# Patient Record
Sex: Female | Born: 1985 | Race: Black or African American | Hispanic: No | Marital: Single | Smoking: Current some day smoker
Health system: Southern US, Community
[De-identification: ages and names within clinical notes are randomized; demographics above are authoritative.]

## PROBLEM LIST (undated history)

## (undated) DIAGNOSIS — J45909 Unspecified asthma, uncomplicated: Secondary | ICD-10-CM

## (undated) DIAGNOSIS — F209 Schizophrenia, unspecified: Secondary | ICD-10-CM

## (undated) DIAGNOSIS — F319 Bipolar disorder, unspecified: Secondary | ICD-10-CM

---

## 2010-09-28 ENCOUNTER — Emergency Department (HOSPITAL_COMMUNITY)
Admission: EM | Admit: 2010-09-28 | Discharge: 2010-09-28 | Payer: Self-pay | Source: Home / Self Care | Admitting: Emergency Medicine

## 2010-12-22 ENCOUNTER — Encounter: Payer: Self-pay | Admitting: Obstetrics & Gynecology

## 2012-02-04 ENCOUNTER — Emergency Department (HOSPITAL_COMMUNITY): Payer: Medicaid Other

## 2012-02-04 ENCOUNTER — Emergency Department (HOSPITAL_COMMUNITY)
Admission: EM | Admit: 2012-02-04 | Discharge: 2012-02-04 | Disposition: A | Discharge status: Home / Self Care | Payer: Medicaid Other | Attending: Emergency Medicine | Admitting: Emergency Medicine

## 2012-02-04 ENCOUNTER — Encounter (HOSPITAL_COMMUNITY): Payer: Self-pay | Admitting: *Deleted

## 2012-02-04 DIAGNOSIS — R079 Chest pain, unspecified: Secondary | ICD-10-CM | POA: Insufficient documentation

## 2012-02-04 DIAGNOSIS — I319 Disease of pericardium, unspecified: Secondary | ICD-10-CM

## 2012-02-04 DIAGNOSIS — R45851 Suicidal ideations: Secondary | ICD-10-CM

## 2012-02-04 DIAGNOSIS — R404 Transient alteration of awareness: Secondary | ICD-10-CM | POA: Insufficient documentation

## 2012-02-04 HISTORY — DX: Schizophrenia, unspecified: F20.9

## 2012-02-04 HISTORY — DX: Bipolar disorder, unspecified: F31.9

## 2012-02-04 LAB — URINALYSIS, ROUTINE W REFLEX MICROSCOPIC
Bilirubin Urine: NEGATIVE
Glucose, UA: NEGATIVE mg/dL
Hgb urine dipstick: NEGATIVE
Protein, ur: NEGATIVE mg/dL
pH: 6 (ref 5.0–8.0)

## 2012-02-04 LAB — COMPREHENSIVE METABOLIC PANEL
ALT: 8 U/L (ref 0–35)
AST: 15 U/L (ref 0–37)
BUN: 11 mg/dL (ref 6–23)
Calcium: 9.8 mg/dL (ref 8.4–10.5)
GFR calc Af Amer: >90 mL/min (ref >90)
GFR calc non Af Amer: >90 mL/min (ref >90)
Glucose, Bld: 85 mg/dL (ref 70–99)
Total Protein: 8.6 g/dL — ABNORMAL HIGH (ref 6.0–8.3)

## 2012-02-04 LAB — RAPID URINE DRUG SCREEN, HOSP PERFORMED
Amphetamines: NOT DETECTED
Barbiturates: NOT DETECTED
Tetrahydrocannabinol: POSITIVE — AB

## 2012-02-04 LAB — DIFFERENTIAL
Lymphocytes Relative: 27 % (ref 12–46)
Monocytes Absolute: 0.4 10*3/uL (ref 0.1–1.0)
Monocytes Relative: 6 % (ref 3–12)
Neutro Abs: 4.4 10*3/uL (ref 1.7–7.7)

## 2012-02-04 LAB — TROPONIN I: Troponin I: <0.3 ng/mL (ref <0.30)

## 2012-02-04 LAB — CBC
Platelets: 257 10*3/uL (ref 150–400)
RBC: 4.43 MIL/uL (ref 3.87–5.11)
RDW: 12.5 % (ref 11.5–15.5)

## 2012-02-04 LAB — URINE MICROSCOPIC-ADD ON

## 2012-02-04 LAB — PREGNANCY, URINE: Preg Test, Ur: NEGATIVE

## 2012-02-04 MED ORDER — SERTRALINE HCL 50 MG PO TABS: 50.0000 mg | ORAL_TABLET | Freq: Every day | ORAL | Status: DC

## 2012-02-04 MED ORDER — SODIUM CHLORIDE 0.9 % IV BOLUS (SEPSIS): 1000.0000 mL | Freq: Once | INTRAVENOUS | Status: DC

## 2012-02-04 MED ORDER — IBUPROFEN 800 MG PO TABS
800.0000 mg | ORAL_TABLET | Freq: Once | ORAL | Status: AC
  Administered 2012-02-04: 800 mg via ORAL
  Filled 2012-02-04: qty 1

## 2012-02-04 MED ORDER — LORAZEPAM 1 MG PO TABS: 1.0000 mg | ORAL_TABLET | Freq: Three times a day (TID) | ORAL | Status: DC | PRN

## 2012-02-04 MED ORDER — ONDANSETRON HCL 4 MG PO TABS: 4.0000 mg | ORAL_TABLET | Freq: Three times a day (TID) | ORAL | Status: DC | PRN

## 2012-02-04 MED ORDER — IBUPROFEN 600 MG PO TABS: 600.0000 mg | ORAL_TABLET | Freq: Three times a day (TID) | ORAL | Status: DC | PRN

## 2012-02-04 NOTE — ED Notes (Signed)
Pt states she was on the  [place] feel to the floor and could not move. Pt states she is able to recall events

## 2012-02-04 NOTE — ED Provider Notes (Signed)
History     CSN: [id number]  Arrival date & time 02/04/12  1341   First MD Initiated Contact with Patient 02/04/12 1401      Chief Complaint  Patient presents with  . Seizures    (Consider location/radiation/quality/duration/timing/severity/associated sxs/prior treatment) HPI  26 year old female with history of schizophrenia, history of bipolar type1 presents with a syncopal episode. Patient was brought to the ED via EMS when family member noticed she passed out. Patient states she was having a verbal argument with her sister when she felt lightheadedness and had apparently "blacked out".  Sts she remembers hitting the side of her head against wall prior to fall.  According to nursing note, family member notices that pt fell, hits head to floor when syncopized.  Sts she regain consciousness but then passed out again.  Pt denies post ictal state.  She denies significant headache, tongue pain, or incontinence.  Her only complaint is chest pain.  Sts she has always had pain to her chest that are intermittent in episodes.  Pain is described as a stabbing sensation and can last for seconds to hours.  Nothing makes it better or worse.  Sts sometimes when she walks it may improves her pain.  She denies associated SOB, weakness or numbness.  Denies n/v/d, abd pain, or diarrhea.  Denies alcohol or rec drug use.  Sts she has normal appetite.  Denies sexual activity.  LMP 3-4 weeks ago.  Sts she did have one similar prior syncopal episode 3 years ago, but denies remembering what triggers it.  Denies exertional syncope.  Has his of childhood seizure but none since.    Past Medical History  Diagnosis Date  . Schizophrenia   . Bipolar 1 disorder     No past surgical history on file.  No family history on file.  History  Substance Use Topics  . Smoking status: Not on file  . Smokeless tobacco: Not on file  . Alcohol Use:     OB History    Grav Para Term Preterm Abortions TAB SAB Ect Mult Living                   Review of Systems  All other systems reviewed and are negative.    Allergies  Review of patient's allergies indicates no known allergies.  Home Medications  No current outpatient prescriptions on file.  BP 129/69  Pulse 78  Temp(Src) 98.1 F (36.7 C) (Oral)  Resp 18  SpO2 98%  Physical Exam  Nursing note and vitals reviewed. Constitutional: She is oriented to person, place, and time. She appears well-developed and well-nourished. No distress.       Awake, alert, nontoxic appearance  HENT:  Head: Normocephalic and atraumatic.  Right Ear: External ear normal.  Left Ear: External ear normal.  Mouth/Throat: Oropharynx is clear and moist. No oropharyngeal exudate.       No hemotympanum, no septal hematoma, head nontender with no evidence of trauma.  No tongue laceration  Eyes: Conjunctivae and EOM are normal. Pupils are equal, round, and reactive to light. Right eye exhibits no discharge. Left eye exhibits no discharge.  Neck: Normal range of motion. Neck supple.  Cardiovascular: Normal rate, regular rhythm, normal heart sounds and intact distal pulses.  Exam reveals no gallop and no friction rub.   No murmur heard. Pulmonary/Chest: Effort normal. No respiratory distress.         Tenderness to L chest on palpation.  No overlying skin changes  Abdominal:  Soft. There is no tenderness. There is no rebound.  Musculoskeletal: She exhibits no tenderness.       ROM appears intact, no obvious focal weakness  Neurological: She is alert and oriented to person, place, and time. She displays a negative Romberg sign. Coordination and gait normal. GCS eye subscore is 4. GCS verbal subscore is 5. GCS motor subscore is 6.       Mental status and motor strength appears intact  Skin: Skin is warm. No rash noted.  Psychiatric: She has a normal mood and affect.    ED Course  Procedures (including critical care time)  Labs Reviewed - No data to display No results  found.   No diagnosis found.   Date: 02/04/2012  Rate: 60  Rhythm: normal sinus rhythm  QRS Axis: normal  Intervals: normal  ST/T Wave abnormalities: PR depression  Conduction Disutrbances:none  Narrative Interpretation: ST elevation suggests pericarditis (unchanged)  Old EKG Reviewed: unchanged  Results for orders placed during the hospital encounter of 02/04/12  CBC      Component Value Range   WBC 6.7  4.0 - 10.5 (K/uL)   RBC 4.43  3.87 - 5.11 (MIL/uL)   Hemoglobin 14.4  12.0 - 15.0 (g/dL)   HCT 16.1  09.6 - 04.5 (%)   MCV 95.3  78.0 - 100.0 (fL)   MCH 32.5  26.0 - 34.0 (pg)   MCHC 34.1  30.0 - 36.0 (g/dL)   RDW 40.9  81.1 - 91.4 (%)   Platelets 257  150 - 400 (K/uL)  DIFFERENTIAL      Component Value Range   Neutrophils Relative 65  43 - 77 (%)   Neutro Abs 4.4  1.7 - 7.7 (K/uL)   Lymphocytes Relative 27  12 - 46 (%)   Lymphs Abs 1.8  0.7 - 4.0 (K/uL)   Monocytes Relative 6  3 - 12 (%)   Monocytes Absolute 0.4  0.1 - 1.0 (K/uL)   Eosinophils Relative 1  0 - 5 (%)   Eosinophils Absolute 0.1  0.0 - 0.7 (K/uL)   Basophils Relative 0  0 - 1 (%)   Basophils Absolute 0.0  0.0 - 0.1 (K/uL)  URINALYSIS, ROUTINE W REFLEX MICROSCOPIC      Component Value Range   Color, Urine YELLOW  YELLOW    APPearance CLOUDY (*) CLEAR    Specific Gravity, Urine 1.024  1.005 - 1.030    pH 6.0  5.0 - 8.0    Glucose, UA NEGATIVE  NEGATIVE (mg/dL)   Hgb urine dipstick NEGATIVE  NEGATIVE    Bilirubin Urine NEGATIVE  NEGATIVE    Ketones, ur TRACE (*) NEGATIVE (mg/dL)   Protein, ur NEGATIVE  NEGATIVE (mg/dL)   Urobilinogen, UA 1.0  0.0 - 1.0 (mg/dL)   Nitrite NEGATIVE  NEGATIVE    Leukocytes, UA TRACE (*) NEGATIVE   PREGNANCY, URINE      Component Value Range   Preg Test, Ur NEGATIVE  NEGATIVE   TROPONIN I      Component Value Range   Troponin I <0.30  <0.30 (ng/mL)  COMPREHENSIVE METABOLIC PANEL      Component Value Range   Sodium 135  135 - 145 (mEq/L)   Potassium 3.8  3.5 - 5.1  (mEq/L)   Chloride 99  96 - 112 (mEq/L)   CO2 24  19 - 32 (mEq/L)   Glucose, Bld 85  70 - 99 (mg/dL)   BUN 11  6 - 23 (mg/dL)  Creatinine, Ser 0.74  0.50 - 1.10 (mg/dL)   Calcium 9.8  8.4 - 78.4 (mg/dL)   Total Protein 8.6 (*) 6.0 - 8.3 (g/dL)   Albumin 4.7  3.5 - 5.2 (g/dL)   AST 15  0 - 37 (U/L)   ALT 8  0 - 35 (U/L)   Alkaline Phosphatase 48  39 - 117 (U/L)   Total Bilirubin 0.7  0.3 - 1.2 (mg/dL)   GFR calc non Af Amer >90  >90 (mL/min)   GFR calc Af Amer >90  >90 (mL/min)  URINE RAPID DRUG SCREEN (HOSP PERFORMED)      Component Value Range   Opiates NONE DETECTED  NONE DETECTED    Cocaine NONE DETECTED  NONE DETECTED    Benzodiazepines NONE DETECTED  NONE DETECTED    Amphetamines NONE DETECTED  NONE DETECTED    Tetrahydrocannabinol POSITIVE (*) NONE DETECTED    Barbiturates NONE DETECTED  NONE DETECTED   URINE MICROSCOPIC-ADD ON      Component Value Range   WBC, UA 0-2  <3 (WBC/hpf)   Urine-Other MUCOUS PRESENT     Dg Chest 2 View  02/04/2012  *RADIOLOGY REPORT*  Clinical Data: Chest pain  CHEST - 2 VIEW  Comparison: 02/04/2012  Findings: Cardiomediastinal silhouette is stable.  No acute infiltrate or pleural effusion.  No pulmonary edema.  Bony thorax is stable.  IMPRESSION: No active disease.  No significant change.  Original Report Authenticated By: Natasha Mead, M.D.   Ct Head Wo Contrast  02/04/2012  *RADIOLOGY REPORT*  Clinical Data:  Fall, loss of consciousness, seizure activity  CT HEAD WITHOUT CONTRAST  Technique:  Contiguous axial images were obtained from the base of the skull through the vertex without contrast  Comparison:  None.  Findings:  The brain has a normal appearance without evidence for hemorrhage, acute infarction, hydrocephalus, or mass lesion.  There is no extra axial fluid collection.  The skull and paranasal sinuses are normal.  IMPRESSION: Normal CT of the head without contrast.  Original Report Authenticated By: Judie Petit. Ruel Favors, M.D.      MDM   Initially, pt was seen for a syncopal episode.  However, family member sts pt is actually having suicidal ideation.  Per sister, patient had an argument with her for no apparent reason, and pt also sts she wants to kill herself and attempt to ingest a full bottle of her depression medication.  However, pt apparently falls to the ground striking her head against door knob and convulsed for 5 min.  Sister is concern that she is not taking her psych medication as recommended.  Does not think she is actually trying to harm herself but wants pt to be reevaluated.    3:34 PM Pt sts she does have thoughts about hurting herself and also thoughts about hurting other people (people that have done wrong to me).  She does not have specific plan.  She is amenable for psych consultation.  I will consult with ACT team for further evaluation.  Pt's cp is more consistent with pericarditis and less likely ACS.  Her CXR is unremarkable.  I have discussed with my attending.  Will treat with ibuprofen.  Cardiology referral given.    3:38 PM I have consulted with ACT team, who agrees to see and evaluated pt.  Pt will need IVC due to recent suicide attempt.     5:53 PM Pt is medically cleared.  Denies cp currently.  Labs are unremarkable.  Head CT and  CXR shows no acute abnormalities.  Pt will be management by ACT for further evaluation of her SI/HI.  Negative delta troponin.    6:11 PM Pt sts she does not want to harm herself.  She thinks she needs to take her antidepressive medication.  She is amendable to f/u with psych.  I will await ACT team to assess pt.  Telepsych ordered.  Pt and family member aware of plan.    7:50 PM If pt is safe to be discharge via telepsych, then please establish f/u guideline.  Her chest pain should be reevaluated by cardiology, which i have placed [facility] cardiology referral.  She should take OTC ibuprofen for pain.   8:13 PM Pt awaits telepsych. Pt report given to Langley Adie, PA-C  who will continue pt's care.   Fayrene Helper, PA-C 02/04/12 2014  Fayrene Helper, PA-C 02/04/12 2014

## 2012-02-04 NOTE — ED Notes (Signed)
Pt discharge instructions reviewed and pt verbalizes understanding. Referrals given by ACT. Pt denies SI/HI and AVH. Departs unit in safe and stable condition.

## 2012-02-04 NOTE — ED Notes (Signed)
WUJ:WJXB<JY> Expected date:02/04/12<BR> Expected time: 1:34 PM<BR> Means of arrival:Ambulance<BR> Comments:<BR> EMS 31 GC. 26 y/o female. Seizure with history of same.

## 2012-02-04 NOTE — ED Notes (Signed)
Sister at bedside-no SI at this time-will continue to monitor

## 2012-02-04 NOTE — ED Notes (Signed)
Refusing iv placement at this time

## 2012-02-04 NOTE — ED Notes (Signed)
Per PA, ok to leave out iv

## 2012-02-04 NOTE — Discharge Instructions (Signed)
Take over the counter ibuprofen for your chest pain.  Follow up with [facility] cardiology at your earliest convenient for further evaluation of your chest pain.  Take your psychiatric medication as recommended.  Follow up with your psychiatrist for further evaluation.  Return to ER if you have any concerns.    Pericarditis Pericarditis is an inflammation of the sac that surrounds the heart. This sac is known as the pericardium. Typically the pericardium contains a smooth lubricating lining. When you have pericarditis, the lining is more like sandpaper. CAUSES   Viral or bacterial infections.   Heart disease.   Heart surgery.   Reaction to a drug.   Kidney failure.   Thyroid problems.   Arthritis.   Cancer.  SYMPTOMS   Sharp chest pain under the breast bone (sternum).   Pain may also be felt in the neck, back or arms.   Pain may feel worse if you lean forward, take a deep breath, or lie down.   Shortness of breath.   Fever.   Pain on swallowing.  DIAGNOSIS  The diagnosis of pericarditis is based on your history,exam findings, and tests. These may include an EKG, chest x-ray, CT studies, blood tests, and echocardiogram.  TREATMENT  Treatment for pericarditis includes medicine for discomfort. An antibiotic may also be needed. With proper treatment, most episodes of pericarditis get better without complications. HOME CARE INSTRUCTIONS   Get plenty of rest.   Avoid activities that increase your pain.   Do not smoke or drink alcohol.   Be sure to see your caregiver as recommended for follow-up to make sure your condition resolves completely.  SEEK IMMEDIATE MEDICAL CARE IF:  You develop pain or shortness of breath that is getting worse.   You develop a high fever and severe abdominal or back pain.   You develop marked weakness, fainting, or any other serious complaint.  Document Released: 11/19/2004 Document Revised: 10/01/2011 Document Reviewed: 01/09/2008 [facility]  Patient Information 2012 [place], [place].

## 2012-02-04 NOTE — ED Notes (Signed)
Per discussion with patients sister, pt with suicidal ideation. MD informed, will monitor and evaluate.
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
## 2012-02-04 NOTE — ED Notes (Signed)
Pt. Family( sister) has belonging
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
## 2012-02-08 NOTE — ED Provider Notes (Signed)
Medical screening examination/treatment/procedure(s) were performed by non-physician practitioner and as supervising physician I was immediately available for consultation/collaboration.   Kier Smead, MD 02/08/12 1509 

## 2013-01-05 ENCOUNTER — Encounter (HOSPITAL_COMMUNITY): Payer: Self-pay | Admitting: Emergency Medicine

## 2013-01-05 ENCOUNTER — Emergency Department (INDEPENDENT_AMBULATORY_CARE_PROVIDER_SITE_OTHER)
Admission: EM | Admit: 2013-01-05 | Discharge: 2013-01-05 | Disposition: A | Discharge status: Home / Self Care | Payer: Medicaid Other | Source: Home / Self Care

## 2013-01-05 DIAGNOSIS — S139XXA Sprain of joints and ligaments of unspecified parts of neck, initial encounter: Secondary | ICD-10-CM

## 2013-01-05 DIAGNOSIS — S161XXA Strain of muscle, fascia and tendon at neck level, initial encounter: Secondary | ICD-10-CM

## 2013-01-05 DIAGNOSIS — M549 Dorsalgia, unspecified: Secondary | ICD-10-CM

## 2013-01-05 MED ORDER — CYCLOBENZAPRINE HCL 10 MG PO TABS: 5.0000 mg | ORAL_TABLET | Freq: Three times a day (TID) | ORAL | Status: AC | PRN

## 2013-01-05 MED ORDER — IBUPROFEN 600 MG PO TABS: 600.0000 mg | ORAL_TABLET | Freq: Four times a day (QID) | ORAL | Status: AC | PRN
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
## 2013-01-06 NOTE — ED Provider Notes (Signed)
Medical screening examination/treatment/procedure(s) were performed by a resident physician and as supervising physician I was immediately available for consultation/collaboration.  Leslee Home, M.D.   Reuben Likes, MD 01/06/13 [phone]

## 2013-06-07 IMAGING — CT CT HEAD W/O CM
2 series · 16 of 30 positions shown, 20 images · non-contrast
Comparison: None.

CLINICAL DATA: Fall, loss of consciousness, seizure activity

CT HEAD WITHOUT CONTRAST
TECHNIQUE: Contiguous axial images were obtained from the base of
the skull through the vertex without contrast

[Series 2: head w/o · axial · non-contrast · 0.43mm/px · z∈[-154,-34]mm · 13 of 29 slices shown, 17 images]
[im 3/29  brain]
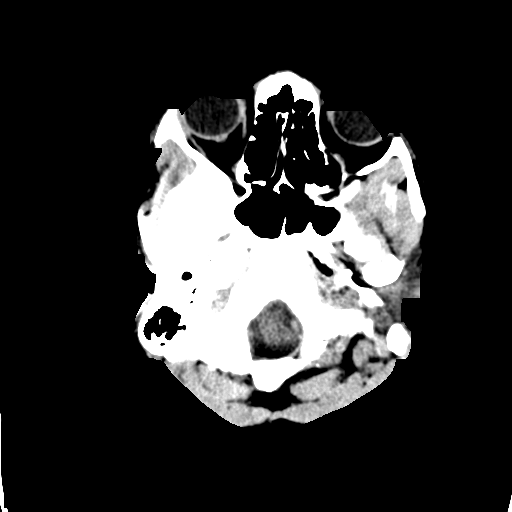
[im 3/29  bone]
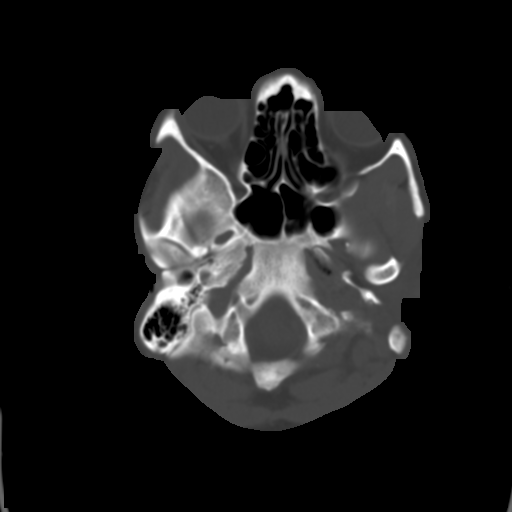
[im 5/29  brain]
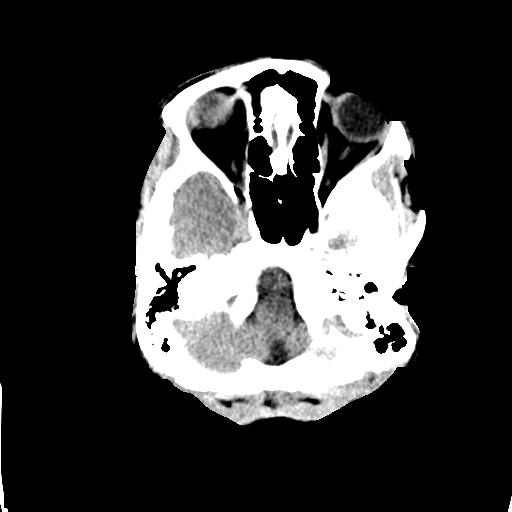
[im 7/29  brain]
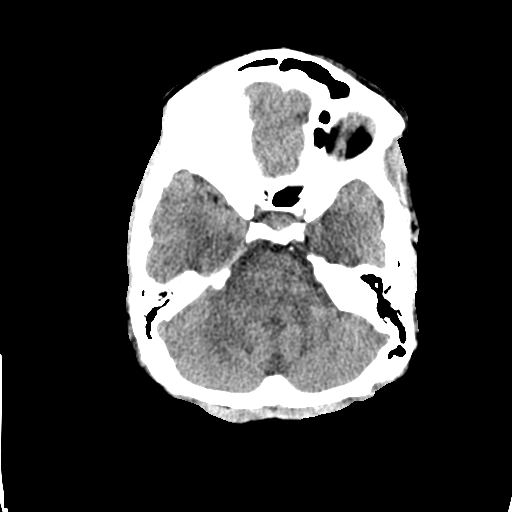
[im 9/29  brain]
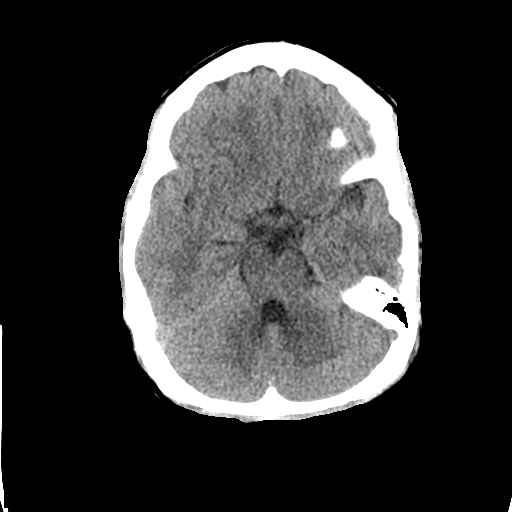
[im 11/29  brain]
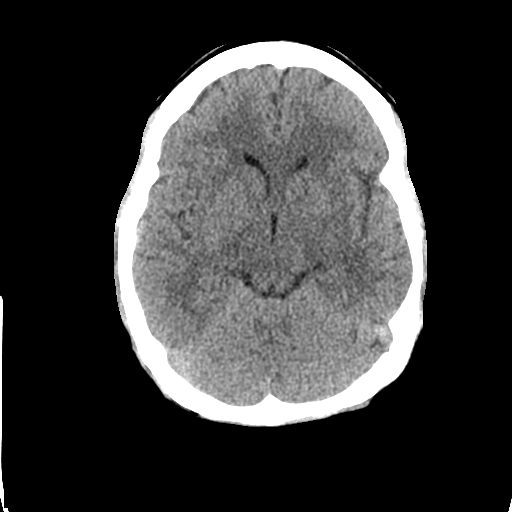
[im 11/29  bone]
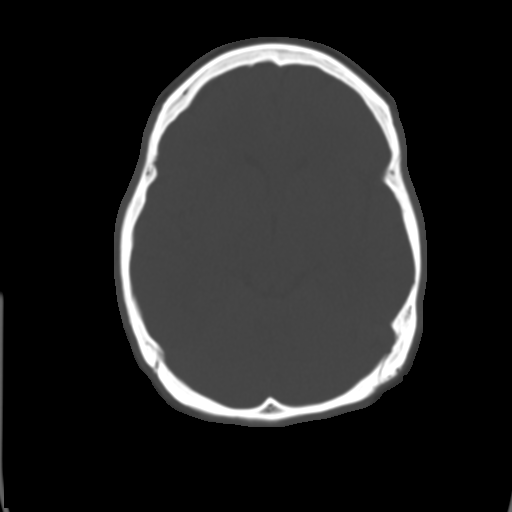
[im 13/29  brain]
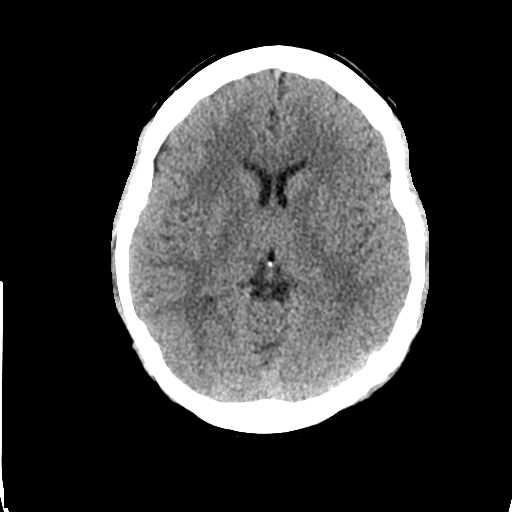
[im 15/29  brain]
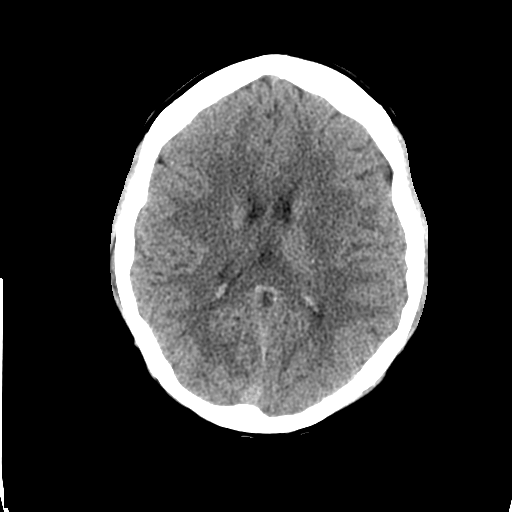
[im 17/29  brain]
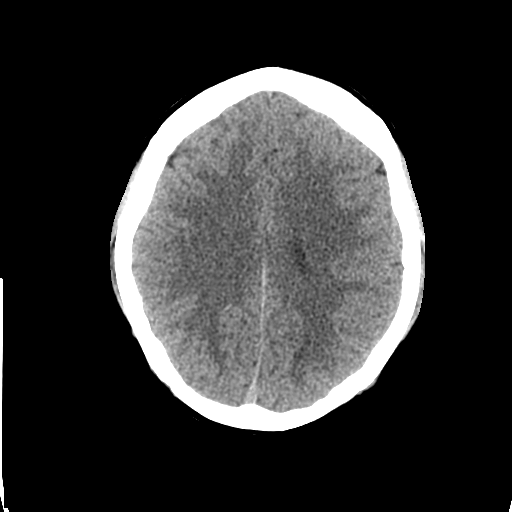
[im 19/29  brain]
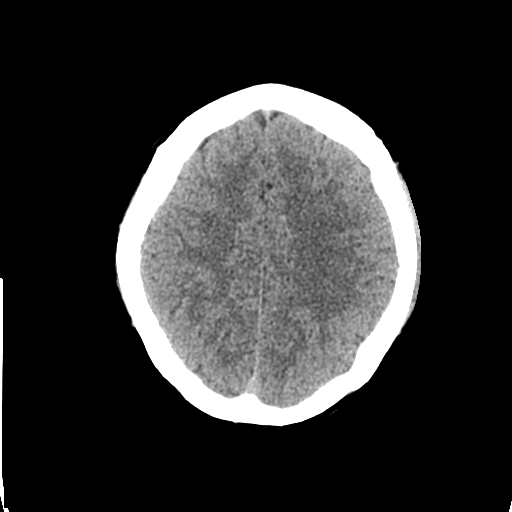
[im 19/29  bone]
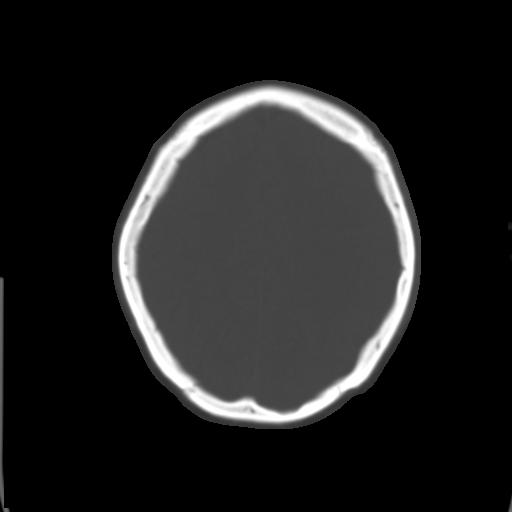
[im 21/29  brain]
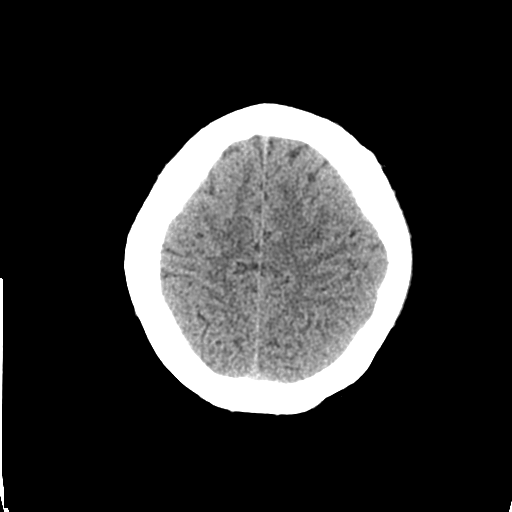
[im 23/29  brain]
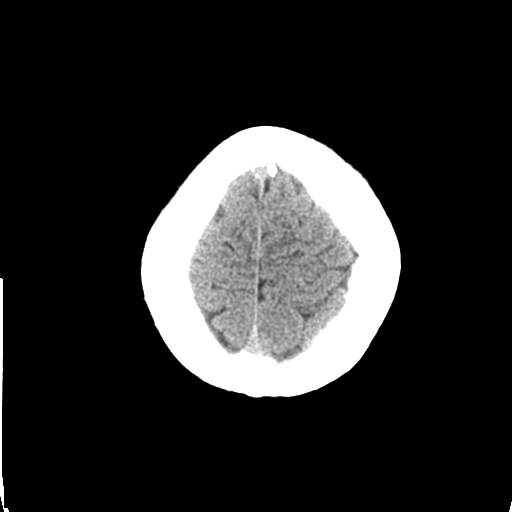
[im 25/29  brain]
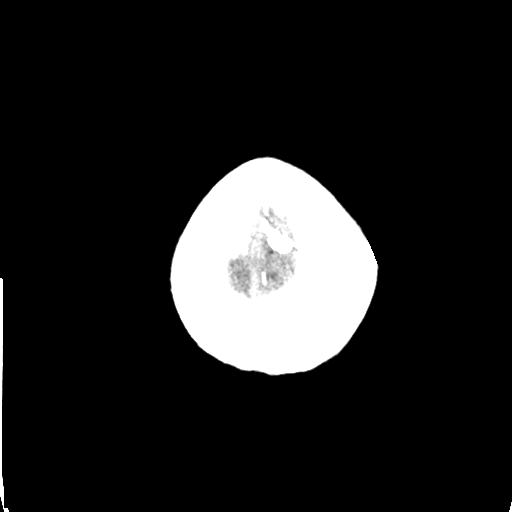
[im 27/29  brain]
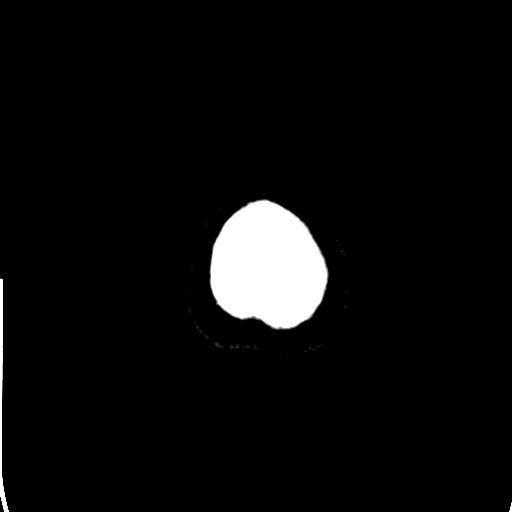
[im 27/29  bone]
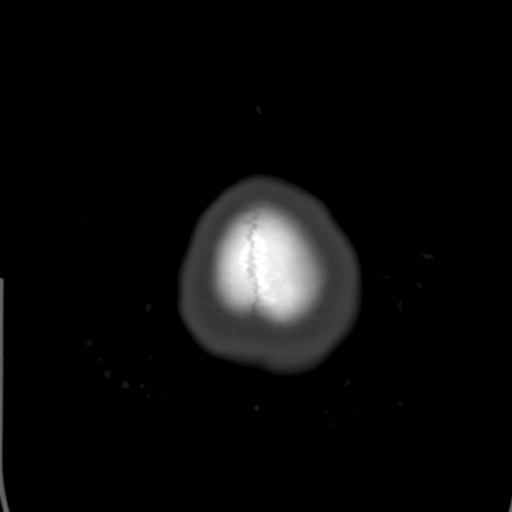

[Series 3: bone windows · axial · 0.43mm/px · z∈[-154,-114]mm · 3 of 29 slices shown]
[im 3/29  bone]
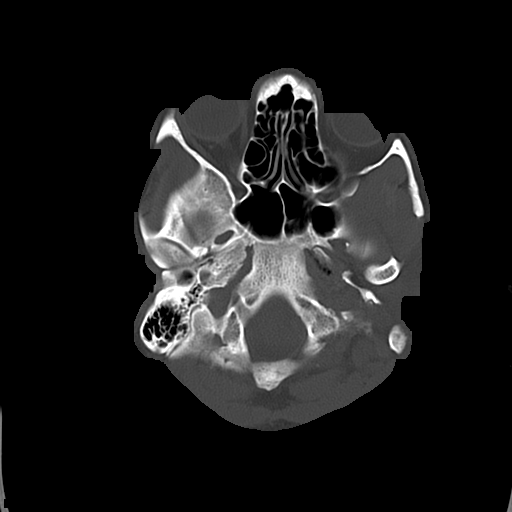
[im 7/29  bone]
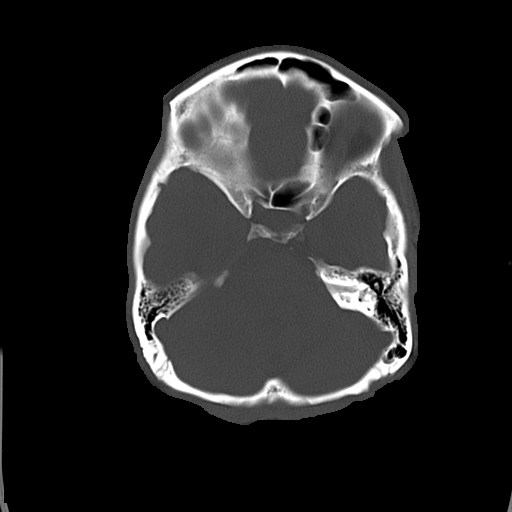
[im 11/29  bone]
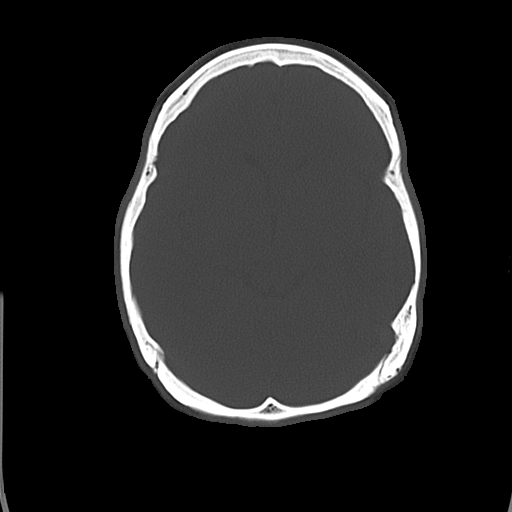

[16 of 30 positions shown; findings below may reference images not displayed]

FINDINGS: The brain has a normal appearance without evidence for
hemorrhage, acute infarction, hydrocephalus, or mass lesion.  There
is no extra axial fluid collection.  The skull and paranasal
sinuses are normal.
IMPRESSION: Normal CT of the head without contrast.

## 2014-12-25 ENCOUNTER — Emergency Department (HOSPITAL_COMMUNITY)
Admission: EM | Admit: 2014-12-25 | Discharge: 2014-12-25 | Disposition: A | Discharge status: Home / Self Care | Payer: Medicaid Other | Attending: Emergency Medicine | Admitting: Emergency Medicine

## 2014-12-25 ENCOUNTER — Encounter (HOSPITAL_COMMUNITY): Payer: Self-pay | Admitting: Emergency Medicine

## 2014-12-25 DIAGNOSIS — Z8659 Personal history of other mental and behavioral disorders: Secondary | ICD-10-CM | POA: Insufficient documentation

## 2014-12-25 DIAGNOSIS — H9201 Otalgia, right ear: Secondary | ICD-10-CM | POA: Diagnosis present

## 2014-12-25 DIAGNOSIS — H6121 Impacted cerumen, right ear: Secondary | ICD-10-CM | POA: Insufficient documentation

## 2014-12-25 DIAGNOSIS — Z720 Tobacco use: Secondary | ICD-10-CM | POA: Insufficient documentation

## 2014-12-25 MED ORDER — HYDROCODONE-ACETAMINOPHEN 5-325 MG PO TABS
1.0000 | ORAL_TABLET | Freq: Once | ORAL | Status: DC
  Filled 2014-12-25: qty 1

## 2014-12-25 MED ORDER — HYDROCODONE-ACETAMINOPHEN 5-325 MG PO TABS: 1.0000 | ORAL_TABLET | ORAL | Status: AC | PRN

## 2014-12-25 MED ORDER — CARBAMIDE PEROXIDE 6.5 % OT SOLN: 5.0000 [drp] | Freq: Once | OTIC | Status: DC

## 2014-12-25 MED ORDER — OFLOXACIN 0.3 % OP SOLN
5.0000 [drp] | Freq: Every day | OPHTHALMIC | Status: DC
  Administered 2014-12-25: 5 [drp] via OTIC
  Filled 2014-12-25: qty 5

## 2014-12-25 NOTE — ED Notes (Signed)
Pt states that she has not been able to hear out of right ear since last night with some pain.

## 2014-12-25 NOTE — Discharge Instructions (Signed)
You have increased redness in the external canal of your right ear. Please use the Ocuflox drops 3 times daily for 5 days. Cerumen Impaction A cerumen impaction is when the wax in your ear forms a plug. This plug usually causes reduced hearing. Sometimes it also causes an earache or dizziness. Removing a cerumen impaction can be difficult and painful. The wax sticks to the ear canal. The canal is sensitive and bleeds easily. If you try to remove a heavy wax buildup with a cotton tipped swab, you may push it in further. Irrigation with water, suction, and small ear curettes may be used to clear out the wax. If the impaction is fixed to the skin in the ear canal, ear drops may be needed for a few days to loosen the wax. People who build up a lot of wax frequently can use ear wax removal products available in your local drugstore. SEEK MEDICAL CARE IF:  You develop an earache, increased hearing loss, or marked dizziness. Document Released: 11/19/2004 Document Revised: 01/04/2012 Document Reviewed: 01/09/2010 [facility] Patient Information 2015 [place], [place]. This information is not intended to replace advice given to you by your health care provider. Make sure you discuss any questions you have with your health care provider. . Use Tylenol or ibuprofen for the throbbing, may use Norco for more severe pain. Norco may cause drowsiness, please use with caution.

## 2014-12-25 NOTE — ED Provider Notes (Signed)
CSN: [id number]     Arrival date & time 12/25/14  1355 History   First MD Initiated Contact with Patient 12/25/14 1428     Chief Complaint  Patient presents with  . Otalgia     (Consider location/radiation/quality/duration/timing/severity/associated sxs/prior Treatment) Patient is a 29 y.o. female presenting with ear pain. The history is provided by the patient.  Otalgia Location:  Right Behind ear:  No abnormality Severity:  Moderate Onset quality:  Gradual Duration:  1 day Timing:  Intermittent Progression:  Worsening Associated symptoms: no abdominal pain, no cough and no neck pain     Past Medical History  Diagnosis Date  . Schizophrenia   . Bipolar 1 disorder    History reviewed. No pertinent past surgical history. History reviewed. No pertinent family history. History  Substance Use Topics  . Smoking status: Current Some Day Smoker -- 0.50 packs/day    Types: Cigarettes  . Smokeless tobacco: Not on file  . Alcohol Use: Yes     Comment: occasional   OB History    No data available     Review of Systems  Constitutional: Negative for activity change.       All ROS Neg except as noted in HPI  HENT: Positive for ear pain. Negative for nosebleeds.   Eyes: Negative for photophobia and discharge.  Respiratory: Negative for cough, shortness of breath and wheezing.   Cardiovascular: Negative for chest pain and palpitations.  Gastrointestinal: Negative for abdominal pain and blood in stool.  Genitourinary: Negative for dysuria, frequency and hematuria.  Musculoskeletal: Negative for back pain, arthralgias and neck pain.  Skin: Negative.   Neurological: Negative for dizziness, seizures and speech difficulty.  Psychiatric/Behavioral: Negative for hallucinations and confusion.      Allergies  Review of patient's allergies indicates no known allergies.  Home Medications   Prior to Admission medications   Medication Sig Start Date End Date Taking? Authorizing  Provider  cyclobenzaprine (FLEXERIL) 10 MG tablet Take 0.5-1 tablets (5-10 mg total) by mouth 3 (three) times daily as needed for muscle spasms. Patient not taking: Reported on 12/25/2014 01/05/13   Marga HootsJacquelyn A McGill, MD  ibuprofen (ADVIL,MOTRIN) 600 MG tablet Take 1 tablet (600 mg total) by mouth every 6 (six) hours as needed for pain. Patient not taking: Reported on 12/25/2014 01/05/13   Harvel QualeJacquelyn A McGill, MD   BP 117/79 mmHg  Pulse 74  Temp(Src) 98.7 F (37.1 C) (Oral)  Resp 18  Ht 5\' 8"  (1.727 m)  Wt 173 lb 2 oz (78.529 kg)  BMI 26.33 kg/m2  SpO2 99%  LMP 12/19/2014 Physical Exam  Constitutional: She is oriented to person, place, and time. She appears well-developed and well-nourished.  Non-toxic appearance.  HENT:  Head: Normocephalic.  Right Ear: Tympanic membrane normal. There is tenderness. No drainage or swelling. No foreign bodies. No mastoid tenderness. Decreased hearing is noted.  Left Ear: Hearing, tympanic membrane, external ear and ear canal normal.  The right EAC is occluded with cerumen. There are areas of increase redness in the EAC.  Eyes: EOM and lids are normal. Pupils are equal, round, and reactive to light.  Neck: Normal range of motion. Neck supple. Carotid bruit is not present.  Cardiovascular: Normal rate, regular rhythm, normal heart sounds, intact distal pulses and normal pulses.   Pulmonary/Chest: Breath sounds normal. No respiratory distress.  Abdominal: Soft. Bowel sounds are normal. There is no tenderness. There is no guarding.  Musculoskeletal: Normal range of motion.  Lymphadenopathy:  Head (right side): No submandibular adenopathy present.       Head (left side): No submandibular adenopathy present.    She has no cervical adenopathy.  Neurological: She is alert and oriented to person, place, and time. She has normal strength. No cranial nerve deficit or sensory deficit.  Skin: Skin is warm and dry.  Psychiatric: She has a normal mood and affect.  Her speech is normal.  Nursing note and vitals reviewed.   ED Course  Procedures (including critical care time) Labs Review Labs Reviewed - No data to display  Imaging Review No results found.   EKG Interpretation None      MDM The right ear was irrigated with removal of cerumen impaction. Repeat examination reveals increased redness of the external auditory canal, and the tympanic membrane is without problem.    Final diagnoses:  None    **I have reviewed nursing notes, vital signs, and all appropriate lab and imaging results for this patient.Kathie Dike, PA-C 12/25/14 1723  Flint Melter, MD 12/26/14 [phone]

## 2016-07-29 ENCOUNTER — Emergency Department (HOSPITAL_COMMUNITY)
Admission: EM | Admit: 2016-07-29 | Discharge: 2016-07-29 | Disposition: A | Discharge status: Home / Self Care | Payer: Medicaid Other | Attending: Emergency Medicine | Admitting: Emergency Medicine

## 2016-07-29 ENCOUNTER — Emergency Department (HOSPITAL_COMMUNITY): Payer: Medicaid Other

## 2016-07-29 ENCOUNTER — Encounter (HOSPITAL_COMMUNITY): Payer: Self-pay | Admitting: Emergency Medicine

## 2016-07-29 DIAGNOSIS — R05 Cough: Secondary | ICD-10-CM | POA: Diagnosis present

## 2016-07-29 DIAGNOSIS — B9789 Other viral agents as the cause of diseases classified elsewhere: Secondary | ICD-10-CM

## 2016-07-29 DIAGNOSIS — Z87898 Personal history of other specified conditions: Secondary | ICD-10-CM

## 2016-07-29 DIAGNOSIS — F17210 Nicotine dependence, cigarettes, uncomplicated: Secondary | ICD-10-CM | POA: Diagnosis not present

## 2016-07-29 DIAGNOSIS — J069 Acute upper respiratory infection, unspecified: Secondary | ICD-10-CM | POA: Insufficient documentation

## 2016-07-29 MED ORDER — ALBUTEROL SULFATE HFA 108 (90 BASE) MCG/ACT IN AERS
2.0000 | INHALATION_SPRAY | Freq: Once | RESPIRATORY_TRACT | Status: AC
  Administered 2016-07-29: 2 via RESPIRATORY_TRACT
  Filled 2016-07-29: qty 6.7

## 2016-07-29 NOTE — ED Provider Notes (Signed)
[facility]EM[facility]Y DEPT Provider Note   CSN: [id number] Arrival date & time: 07/29/16  1105     History   Chief Complaint Chief Complaint  Patient presents with  . Cough    HPI Misty Munoz is a 30 y.o. female presenting with a non productive cough and wheezing which started yesterday evening.  She denies fevers or chills, chest pain or shortness of breath, and is a nonsmoker, but endorses she is exposed to heavy secondhand smoke.  She does not have a history of asthma but reports wheezing yesterday evening prior to bed.  She has had no headaches, dizziness, abdominal pain, nausea or vomiting.  She's had no medications prior to arrival.  She has had mild nasal congestion with clear rhinorrhea.  The history is provided by the patient.    Past Medical History:  Diagnosis Date  . Bipolar 1 disorder (HCC)   . Schizophrenia ([facility])     There are no active problems to display for this patient.   History reviewed. No pertinent surgical history.  OB History    No data available       Home Medications    Prior to Admission medications   Medication Sig Start Date End Date Taking? Authorizing Provider  cyclobenzaprine (FLEXERIL) 10 MG tablet Take 0.5-1 tablets (5-10 mg total) by mouth 3 (three) times daily as needed for muscle spasms. Patient not taking: Reported on 12/25/2014 01/05/13   Marga Hoots McGill, MD  HYDROcodone-acetaminophen (NORCO/VICODIN) 5-325 MG per tablet Take 1 tablet by mouth every 4 (four) hours as needed. 12/25/14   Ivery Quale, PA-C  ibuprofen (ADVIL,MOTRIN) 600 MG tablet Take 1 tablet (600 mg total) by mouth every 6 (six) hours as needed for pain. Patient not taking: Reported on 12/25/2014 01/05/13   Tito Dine, MD    Family History No family history on file.  Social History Social History  Substance Use Topics  . Smoking status: Current Some Day Smoker    Packs/day: 0.50    Types: Cigarettes  . Smokeless tobacco: Never Used  . Alcohol use Yes   Comment: occasional     Allergies   Review of patient's allergies indicates no known allergies.   Review of Systems Review of Systems  Constitutional: Negative for fever.  HENT: Positive for congestion and rhinorrhea. Negative for sore throat.   Eyes: Negative.   Respiratory: Positive for cough and wheezing. Negative for chest tightness and shortness of breath.   Cardiovascular: Negative for chest pain.  Gastrointestinal: Negative for abdominal pain, nausea and vomiting.  Genitourinary: Negative.   Musculoskeletal: Negative for arthralgias, joint swelling and neck pain.  Skin: Negative.  Negative for rash and wound.  Neurological: Negative for dizziness, weakness, light-headedness, numbness and headaches.  Psychiatric/Behavioral: Negative.      Physical Exam Updated Vital Signs BP 128/82   Pulse 81   Temp 98.5 F (36.9 C)   Resp 18   Ht 5\' 8"  (1.727 m)   LMP 07/29/2016   SpO2 99%   Physical Exam  Constitutional: She is oriented to person, place, and time. She appears well-developed and well-nourished.  HENT:  Head: Normocephalic and atraumatic.  Right Ear: Tympanic membrane and ear canal normal.  Left Ear: Tympanic membrane and ear canal normal.  Nose: Mucosal edema and rhinorrhea present.  Mouth/Throat: Uvula is midline, oropharynx is clear and moist and mucous membranes are normal. No oropharyngeal exudate, posterior oropharyngeal edema, posterior oropharyngeal erythema or tonsillar abscesses.  Eyes: Conjunctivae are normal.  Cardiovascular: Normal rate  and normal heart sounds.   Pulmonary/Chest: Effort normal. No respiratory distress. She has no wheezes. She has no rales.  Lungs clear to auscultation bilaterally.  Abdominal: Soft. There is no tenderness.  Musculoskeletal: Normal range of motion.  Neurological: She is alert and oriented to person, place, and time.  Skin: Skin is warm and dry. No rash noted.  Psychiatric: She has a normal mood and affect.      ED Treatments / Results  Labs (all labs ordered are listed, but only abnormal results are displayed) Labs Reviewed - No data to display  EKG  EKG Interpretation None       Radiology Dg Chest 2 View  Result Date: 07/29/2016 CLINICAL DATA:  Short of breath, wheezing EXAM: CHEST  2 VIEW COMPARISON:  None. FINDINGS: Normal mediastinum and cardiac silhouette. Normal pulmonary vasculature. No evidence of effusion, infiltrate, or pneumothorax. No acute bony abnormality. IMPRESSION: Normal chest radiograph Electronically Signed   By: Genevive BiStewart  Edmunds M.D.   On: 07/29/2016 12:45    Procedures Procedures (including critical care time)  Medications Ordered in ED Medications  albuterol (PROVENTIL HFA;VENTOLIN HFA) 108 (90 Base) MCG/ACT inhaler 2 puff (2 puffs Inhalation Given 07/29/16 1330)     Initial Impression / Assessment and Plan / ED Course  I have reviewed the triage vital signs and the nursing notes.  Pertinent labs & imaging results that were available during my care of the patient were reviewed by me and considered in my medical decision making (see chart for details).  Clinical Course    Examined history suggesting viral process.  No wheezing on exam but given history will treat with albuterol.  She was given an MDI with a spacer and instructed in home use.  Advise when necessary follow-up for any persistent or worsening symptoms.  She was given referrals for obtaining a PCP.  Final Clinical Impressions(s) / ED Diagnoses   Final diagnoses:  Viral URI with cough  H/O wheezing    New Prescriptions New Prescriptions   No medications on file     Burgess AmorJulie Ayvin Lipinski, PA-C 07/29/16 1343    Linwood DibblesJon Knapp, MD 07/29/16 1432

## 2016-07-29 NOTE — Discharge Instructions (Signed)
You may take 2 puffs of the albuterol inhaler every 4 hours if you are coughing or wheezing.

## 2016-07-29 NOTE — ED Triage Notes (Signed)
Pt c/o cough since last night. Productive cough- clear sputum.

## 2021-12-19 ENCOUNTER — Encounter (HOSPITAL_COMMUNITY): Payer: Self-pay | Admitting: Emergency Medicine

## 2021-12-19 ENCOUNTER — Emergency Department (HOSPITAL_COMMUNITY)
Admission: EM | Admit: 2021-12-19 | Discharge: 2021-12-20 | Disposition: A | Discharge status: Home / Self Care | Payer: Medicaid Other | Attending: Emergency Medicine | Admitting: Emergency Medicine

## 2021-12-19 ENCOUNTER — Other Ambulatory Visit: Payer: Self-pay

## 2021-12-19 ENCOUNTER — Encounter (HOSPITAL_COMMUNITY): Payer: Self-pay

## 2021-12-19 ENCOUNTER — Ambulatory Visit (HOSPITAL_COMMUNITY)
Admission: EM | Admit: 2021-12-19 | Discharge: 2021-12-19 | Disposition: A | Discharge status: Home / Self Care | Payer: Medicaid Other | Attending: Nurse Practitioner | Admitting: Nurse Practitioner

## 2021-12-19 ENCOUNTER — Ambulatory Visit: Payer: Self-pay

## 2021-12-19 DIAGNOSIS — M79605 Pain in left leg: Secondary | ICD-10-CM

## 2021-12-19 DIAGNOSIS — R0789 Other chest pain: Secondary | ICD-10-CM | POA: Insufficient documentation

## 2021-12-19 DIAGNOSIS — J45909 Unspecified asthma, uncomplicated: Secondary | ICD-10-CM | POA: Insufficient documentation

## 2021-12-19 DIAGNOSIS — M7989 Other specified soft tissue disorders: Secondary | ICD-10-CM

## 2021-12-19 DIAGNOSIS — R600 Localized edema: Secondary | ICD-10-CM | POA: Diagnosis not present

## 2021-12-19 DIAGNOSIS — Z8709 Personal history of other diseases of the respiratory system: Secondary | ICD-10-CM | POA: Diagnosis not present

## 2021-12-19 HISTORY — DX: Unspecified asthma, uncomplicated: J45.909

## 2021-12-19 LAB — CBC WITH DIFFERENTIAL/PLATELET
Abs Immature Granulocytes: 0.03 10*3/uL (ref 0.00–0.07)
Basophils Absolute: 0.1 10*3/uL (ref 0.0–0.1)
Basophils Relative: 1 %
Eosinophils Absolute: 0.1 10*3/uL (ref 0.0–0.5)
Eosinophils Relative: 1 %
HCT: 37.5 % (ref 36.0–46.0)
Hemoglobin: 12.9 g/dL (ref 12.0–15.0)
Immature Granulocytes: 0 %
Lymphocytes Relative: 35 %
Lymphs Abs: 2.3 10*3/uL (ref 0.7–4.0)
MCH: 33.4 pg (ref 26.0–34.0)
MCHC: 34.4 g/dL (ref 30.0–36.0)
MCV: 97.2 fL (ref 80.0–100.0)
Monocytes Absolute: 0.5 10*3/uL (ref 0.1–1.0)
Monocytes Relative: 7 %
Neutro Abs: 3.7 10*3/uL (ref 1.7–7.7)
Neutrophils Relative %: 56 %
Platelets: 272 10*3/uL (ref 150–400)
RBC: 3.86 MIL/uL — ABNORMAL LOW (ref 3.87–5.11)
RDW: 12.5 % (ref 11.5–15.5)
WBC: 6.7 10*3/uL (ref 4.0–10.5)
nRBC: 0 % (ref 0.0–0.2)

## 2021-12-19 LAB — BASIC METABOLIC PANEL
Anion gap: 7 (ref 5–15)
BUN: 9 mg/dL (ref 6–20)
CO2: 25 mmol/L (ref 22–32)
Calcium: 9.4 mg/dL (ref 8.9–10.3)
Chloride: 105 mmol/L (ref 98–111)
Creatinine, Ser: 0.76 mg/dL (ref 0.44–1.00)
GFR, Estimated: >60 mL/min (ref >60)
Glucose, Bld: 93 mg/dL (ref 70–99)
Potassium: 3.5 mmol/L (ref 3.5–5.1)
Sodium: 137 mmol/L (ref 135–145)

## 2021-12-19 LAB — I-STAT BETA HCG BLOOD, ED (MC, WL, AP ONLY): I-stat hCG, quantitative: <5 m[IU]/mL (ref <5)

## 2021-12-19 MED ORDER — SODIUM CHLORIDE (PF) 0.9 % IJ SOLN
INTRAMUSCULAR | Status: AC
  Filled 2021-12-19: qty 50

## 2021-12-19 MED ORDER — ALBUTEROL SULFATE HFA 108 (90 BASE) MCG/ACT IN AERS: 1.0000 | INHALATION_SPRAY | Freq: Four times a day (QID) | RESPIRATORY_TRACT | 0 refills | Status: AC | PRN

## 2021-12-19 NOTE — ED Triage Notes (Signed)
Pt c/o left calf swelling since yesterday. Reports pain is radiating from left foot today. Denies injury. Reports walks on tip toes and does a lot of standing.

## 2021-12-19 NOTE — Telephone Encounter (Signed)
°  Chief Complaint: leg swelling Symptoms: leg pain, swelling, and warmth in L calf Frequency: today Pertinent Negatives: unable to walk on LLE Disposition: [x] ED /[] Urgent Care (no appt availability in office) / [] Appointment(In office/virtual)/ []  [facility]/ [] Home Care/ [] Refused Recommended Disposition /[] [facility]/ []  Follow-up with PCP Additional Notes: advised pt to go to ED to be evaluated for possible DVT.   Reason for Disposition  [1] Thigh or calf pain AND [2] only 1 side AND [3] present > 1 hour  Answer Assessment - Initial Assessment Questions 1. ONSET: "When did the swelling start?" (e.g., minutes, hours, days)     This morning 2. LOCATION: "What part of the leg is swollen?"  "Are both legs swollen or just one leg?"     LLE, calf area 3. SEVERITY: "How bad is the swelling?" (e.g., localized; mild, moderate, severe)  - Localized - small area of swelling localized to one leg  - MILD pedal edema - swelling limited to foot and ankle, pitting edema < 1/4 inch (6 mm) deep, rest and elevation eliminate most or all swelling  - MODERATE edema - swelling of lower leg to knee, pitting edema > 1/4 inch (6 mm) deep, rest and elevation only partially reduce swelling  - SEVERE edema - swelling extends above knee, facial or hand swelling present      moderate 4. REDNESS: "Does the swelling look red or infected?"     No 5. PAIN: "Is the swelling painful to touch?" If Yes, ask: "How painful is it?"   (Scale 1-10; mild, moderate or severe)     10 when walking,  6. FEVER: "Do you have a fever?" If Yes, ask: "What is it, how was it measured, and when did it start?"      No 10. OTHER SYMPTOMS: "Do you have any other symptoms?" (e.g., chest pain, difficulty breathing)       Tightness  Protocols used: Leg Swelling and Edema-A-AH

## 2021-12-19 NOTE — ED Triage Notes (Signed)
Patient sent from urgent care. Woke up with left calf swelling yesterday. It hurts to walk. She walks on her tip toes and it has been hurting all day long. She limps when she walks. She was sent here for ultrasound/CT scans.

## 2021-12-19 NOTE — ED Provider Triage Note (Signed)
Emergency Medicine Provider Triage Evaluation Note  Misty Munoz , a 36 y.o. female  was evaluated in triage.  Pt complains of left lower extremity pain and swelling.  States she has been struggling with lower extremity pain for years however recently significantly worsened to the point she has not been able to walk.  She was referred from urgent care for evaluation of DVT/PE.  She also endorses chest pain and shortness of breath states this has been ongoing for years intermittently.  Most recently today and yesterday.  Denies extended travel.  She is a smoker.  Does not use birth control pills.  Review of Systems  Positive: As above Negative: As above  Physical Exam  BP 125/90 (BP Location: Left Arm)    Pulse 98    Temp 98.3 F (36.8 C) (Oral)    Resp 15    Ht 5\' 8"  (1.727 m)    Wt 81.6 kg    LMP 12/12/2021    SpO2 99%    BMI 27.37 kg/m  Gen:   Awake, no distress   Resp:  Normal effort  MSK:   Moves extremities without difficulty  Other:  Left lower extremity with swelling and pain compared to the right.  2+ DP pulse present bilaterally.  Without overlying erythema  Medical Decision Making  Medically screening exam initiated at 10:32 PM.  Appropriate orders placed.  12/14/2021 was informed that the remainder of the evaluation will be completed by another provider, this initial triage assessment does not replace that evaluation, and the importance of remaining in the ED until their evaluation is complete.     Darvin Neighbours, PA-C 12/19/21 2235

## 2021-12-19 NOTE — Discharge Instructions (Addendum)
Please go to the Emergency Room to have a complete evaluation of your left leg.  I am concerned there may be a blood clot and this needs to be diagnosed with an ultrasound.

## 2021-12-19 NOTE — ED Provider Notes (Addendum)
[place]    CSN: [id number] Arrival date & time: 12/19/21  1343      History   Chief Complaint Chief Complaint  Patient presents with   Leg Pain    HPI Misty Munoz is a 36 y.o. female.   Patient reports 2-day history of left leg pain.  She reports the pain started abruptly in her calf last night.  This morning, when she woke up, the pain is in her left foot radiating up her entire left leg.  She reports her leg is swollen, warm, and tender.  She also reports she is a heavy cigarette smoker.  She reports she does not take oral contraceptives, last menstrual period last week.  She has not taken anything for the pain.  She also reports she was recently fired from her job because of her left leg pain.  She reports she works at [employer] and stands on her feet a lot for work.   Patient also reports difficulty breathing at nighttime only.  She reports she has a history of asthma and is requesting refill of albuterol inhaler.  Denies any wheezing or difficulty breathing today.   Past Medical History:  Diagnosis Date   Asthma    Bipolar 1 disorder ([place])    Schizophrenia ([place])     There are no problems to display for this patient.   History reviewed. No pertinent surgical history.  OB History   No obstetric history on file.      Home Medications    Prior to Admission medications   Medication Sig Start Date End Date Taking? Authorizing Provider  albuterol (VENTOLIN HFA) 108 (90 Base) MCG/ACT inhaler Inhale 1-2 puffs into the lungs every 6 (six) hours as needed for wheezing or shortness of breath. 12/19/21  Yes Eulogio Bear, NP  cyclobenzaprine (FLEXERIL) 10 MG tablet Take 0.5-1 tablets (5-10 mg total) by mouth 3 (three) times daily as needed for muscle spasms. Patient not taking: Reported on 12/25/2014 01/05/13   McGill, Edison Pace, MD  HYDROcodone-acetaminophen (NORCO/VICODIN) 5-325 MG per tablet Take 1 tablet by mouth every 4 (four) hours as  needed. 12/25/14   Lily Kocher, PA-C  ibuprofen (ADVIL,MOTRIN) 600 MG tablet Take 1 tablet (600 mg total) by mouth every 6 (six) hours as needed for pain. Patient not taking: Reported on 12/25/2014 01/05/13   McGill, Edison Pace, MD    Family History No family history on file.  Social History Social History   Tobacco Use   Smoking status: Some Days    Packs/day: 0.50    Types: Cigarettes   Smokeless tobacco: Never  Substance Use Topics   Alcohol use: Yes    Comment: occasional   Drug use: No     Allergies   Patient has no known allergies.   Review of Systems Review of Systems  Constitutional: Negative.   Respiratory: Negative.    Cardiovascular: Negative.   Musculoskeletal:  Positive for arthralgias and gait problem. Negative for joint swelling.  Skin: Negative.   Psychiatric/Behavioral: Negative.      Physical Exam Triage Vital Signs ED Triage Vitals  Enc Vitals Group     BP 12/19/21 1439 139/90     Pulse Rate 12/19/21 1439 90     Resp 12/19/21 1439 17     Temp 12/19/21 1439 98.3 F (36.8 C)     Temp Source 12/19/21 1439 Oral     SpO2 12/19/21 1439 99 %     Weight --  Height --      Head Circumference --      Peak Flow --      Pain Score 12/19/21 1435 10     Pain Loc --      Pain Edu? --      Excl. in [place]? --    No data found.  Updated Vital Signs BP 139/90 (BP Location: Left Arm)    Pulse 90    Temp 98.3 F (36.8 C) (Oral)    Resp 17    LMP 12/12/2021    SpO2 99%   Visual Acuity Right Eye Distance:   Left Eye Distance:   Bilateral Distance:    Right Eye Near:   Left Eye Near:    Bilateral Near:     Physical Exam Vitals and nursing note reviewed.  Constitutional:      General: She is not in acute distress.    Appearance: Normal appearance. She is not toxic-appearing.  Cardiovascular:     Rate and Rhythm: Normal rate and regular rhythm.  Pulmonary:     Effort: Pulmonary effort is normal. No respiratory distress.     Breath sounds:  Examination of the right-middle field reveals decreased breath sounds. Examination of the left-middle field reveals decreased breath sounds. Examination of the left-lower field reveals decreased breath sounds. Decreased breath sounds present. No wheezing, rhonchi or rales.  Musculoskeletal:     Right lower leg: No edema.     Left lower leg: Edema present.     Comments: Slight nonpitting edema and mild warmth noted to left lower extremity.  Extremely TTP with light palpation.  Bevelyn Buckles' sign difficult to perform with accuracy given pain.  Skin:    General: Skin is warm and dry.     Coloration: Skin is not jaundiced or pale.  Neurological:     Mental Status: She is alert and oriented to person, place, and time.     Motor: No weakness.     Gait: Gait abnormal (favoring left leg).     UC Treatments / Results  Labs (all labs ordered are listed, but only abnormal results are displayed) Labs Reviewed - No data to display  EKG   Radiology No results found.  Procedures Procedures (including critical care time)  Medications Ordered in UC Medications - No data to display  Initial Impression / Assessment and Plan / UC Course  I have reviewed the triage vital signs and the nursing notes.  Pertinent labs & imaging results that were available during my care of the patient were reviewed by me and considered in my medical decision making (see chart for details).    Given extreme tenderness and mild warmth to left lower extremity and smoking history, concern for acute DVT of left lower extremity.  I encouraged the patient to immediately go to the emergency room to have evaluation and ultrasound of left lower extremity.  Vital signs are stable today, therefore I think it is reasonable that she transfer herself to the emergency room.  Patient is in agreement with this plan and all questions answered.  Given reported history of asthma and difficulty with breathing at nighttime, albuterol inhaler  ordered.  I encouraged her to establish care with her primary care provider for ongoing management of asthma. Final Clinical Impressions(s) / UC Diagnoses   Final diagnoses:  Pain of left lower extremity  History of asthma     Discharge Instructions      Please go to the Emergency Room to have a  complete evaluation of your left leg.  I am concerned there may be a blood clot and this needs to be diagnosed with an ultrasound.      ED Prescriptions     Medication Sig Dispense Auth. Provider   albuterol (VENTOLIN HFA) 108 (90 Base) MCG/ACT inhaler Inhale 1-2 puffs into the lungs every 6 (six) hours as needed for wheezing or shortness of breath. 6.7 g Eulogio Bear, NP      PDMP not reviewed this encounter.   Eulogio Bear, NP 12/19/21 1539    Eulogio Bear, NP 12/19/21 1540

## 2021-12-20 ENCOUNTER — Emergency Department (HOSPITAL_COMMUNITY): Payer: Medicaid Other

## 2021-12-20 ENCOUNTER — Ambulatory Visit (HOSPITAL_COMMUNITY): Admission: RE | Admit: 2021-12-20 | Payer: Medicaid Other | Source: Ambulatory Visit

## 2021-12-20 MED ORDER — ONDANSETRON HCL 4 MG/2ML IJ SOLN
4.0000 mg | INTRAMUSCULAR | Status: DC | PRN
  Administered 2021-12-20: 4 mg via INTRAVENOUS
  Filled 2021-12-20: qty 2

## 2021-12-20 MED ORDER — MORPHINE SULFATE (PF) 4 MG/ML IV SOLN
4.0000 mg | Freq: Once | INTRAVENOUS | Status: AC
  Administered 2021-12-20: 4 mg via INTRAVENOUS
  Filled 2021-12-20: qty 1

## 2021-12-20 MED ORDER — ENOXAPARIN SODIUM 80 MG/0.8ML IJ SOSY
80.0000 mg | PREFILLED_SYRINGE | Freq: Once | INTRAMUSCULAR | Status: AC
  Administered 2021-12-20: 80 mg via SUBCUTANEOUS
  Filled 2021-12-20: qty 0.8

## 2021-12-20 NOTE — Discharge Instructions (Signed)
We have rescheduled for an ultrasound of her left lower leg tomorrow at 11 AM.  Information for this appointment is here in your discharge papers.  We have also given you an injection of an anticoagulant that will help thin your blood.  Given your symptoms and your physical exam findings, we are less concerned that you have a blood clot in your lungs.  If you develop shortness of breath, cough, worsening chest pain or elevated heart rate, please return to the ED.

## 2021-12-20 NOTE — ED Provider Notes (Signed)
[facility] DEPT Provider Note   CSN: [id number] Arrival date & time: 12/19/21  2213     History  Chief Complaint  Patient presents with   Leg Swelling    [facility] Munoz is a 36 y.o. female with history of asthma, schizophrenia who was sent here by urgent care out of concern for DVT of the left lower extremity.  Patient initially presented for evaluation of swelling and pain to the LLE starting 2 days ago.  She notes pain started abruptly, initially felt at the bottom of her foot radiating up towards the posterior calf.  She feels that she has to tiptoe in order to minimize pain.  Patient's girlfriend noted that her leg appeared very swollen last night as well.  She does stand quite a bit for her job at [employer] although has not been to work in 2 days due to her discomfort.  She does have mild intermittent chest pain with shortness of breath at nighttime only.  She often uses her albuterol inhaler without relief.  She denies palpitations cough, abdominal pain, nausea, vomiting and diarrhea.  HPI     Home Medications Prior to Admission medications   Medication Sig Start Date End Date Taking? Authorizing Provider  albuterol (VENTOLIN HFA) 108 (90 Base) MCG/ACT inhaler Inhale 1-2 puffs into the lungs every 6 (six) hours as needed for wheezing or shortness of breath. 12/19/21   Eulogio Bear, NP  cyclobenzaprine (FLEXERIL) 10 MG tablet Take 0.5-1 tablets (5-10 mg total) by mouth 3 (three) times daily as needed for muscle spasms. Patient not taking: Reported on 12/25/2014 01/05/13   McGill, Edison Pace, MD  HYDROcodone-acetaminophen (NORCO/VICODIN) 5-325 MG per tablet Take 1 tablet by mouth every 4 (four) hours as needed. 12/25/14   Lily Kocher, PA-C  ibuprofen (ADVIL,MOTRIN) 600 MG tablet Take 1 tablet (600 mg total) by mouth every 6 (six) hours as needed for pain. Patient not taking: Reported on 12/25/2014 01/05/13   McGill, Edison Pace, MD       Allergies    Patient has no known allergies.    Review of Systems   Review of Systems  Cardiovascular:  Positive for leg swelling.   Physical Exam Updated Vital Signs BP 113/63 (BP Location: Right Arm)    Pulse 60    Temp 98.1 F (36.7 C) (Oral)    Resp 18    Ht 5\' 8"  (1.727 m)    Wt 81.6 kg    LMP 12/12/2021    SpO2 100%    BMI 27.37 kg/m  Physical Exam Vitals and nursing note reviewed.  Constitutional:      General: She is not in acute distress.    Appearance: She is not ill-appearing.  HENT:     Head: Atraumatic.  Eyes:     Conjunctiva/sclera: Conjunctivae normal.  Cardiovascular:     Rate and Rhythm: Normal rate and regular rhythm.     Pulses: Normal pulses.          Radial pulses are 2+ on the right side and 2+ on the left side.       Dorsalis pedis pulses are 2+ on the right side and 2+ on the left side.     Heart sounds: No murmur heard.    Comments: Mild nonpitting edema of the left lower extremity.  Moderate tenderness palpation of the posterior calf.  2+ DP pulses.  Sensation intact. Pulmonary:     Effort: Pulmonary effort is normal. No respiratory distress.  Breath sounds: Normal breath sounds.     Comments: Lung clear to ausculation bilaterally. No tachypnea, no accessory muscle use, no acute distress, no increased work of breathing, no decrease in air movement  Abdominal:     General: Abdomen is flat. There is no distension.     Palpations: Abdomen is soft.     Tenderness: There is no abdominal tenderness.  Musculoskeletal:        General: Normal range of motion.     Cervical back: Normal range of motion.     Right lower leg: No edema.     Left lower leg: 1+ Edema present.  Skin:    General: Skin is warm and dry.     Capillary Refill: Capillary refill takes less than 2 seconds.  Neurological:     General: No focal deficit present.     Mental Status: She is alert.  Psychiatric:        Mood and Affect: Mood normal.    ED Results / Procedures /  Treatments   Labs (all labs ordered are listed, but only abnormal results are displayed) Labs Reviewed  CBC WITH DIFFERENTIAL/PLATELET - Abnormal; Notable for the following components:      Result Value   RBC 3.86 (*)    All other components within normal limits  BASIC METABOLIC PANEL  I-STAT BETA HCG BLOOD, ED ([facility], [facility], [facility] ONLY)    EKG None  Radiology No results found.  Procedures Procedures    Medications Ordered in ED Medications  sodium chloride (PF) 0.9 % injection (has no administration in time range)  ondansetron (ZOFRAN) injection 4 mg (4 mg Intravenous Given 12/20/21 0024)  morphine (PF) 4 MG/ML injection 4 mg (4 mg Intravenous Given 12/20/21 0025)  enoxaparin (LOVENOX) injection 80 mg (80 mg Subcutaneous Given 12/20/21 0149)    ED Course/ Medical Decision Making/ A&P                           Medical Decision Making Amount and/or Complexity of Data Reviewed ECG/medicine tests: ordered.  Risk Prescription drug management.   History:  Per HPI Social determinants of health: Patient is documented history of schizophrenia and bipolar.   Initial impression:  This patient presents to the ED for concern of left leg swelling, this involves an extensive number of treatment options, and is a complaint that carries with it a high risk of complications and morbidity.   Differentials include DVT, cellulitis, thrombophlebitis, fracture, muscle strain, venous stasis, CHF Patient is an overall well-appearing 36 year old female in no acute distress.  Nontoxic-appearing.  Her vitals are stable, nontachycardic.  There is some mild swelling of the left lower extremity with tenderness to palpation of the posterior calf.  Unfortunately, we do not have venous ultrasound available to [place] this late night.  CTA was ordered in triage, however after discussion with my attending, Dr. Ralene Bathe, concern for PE is low.  Patient is not endorsing chest pain, shortness of breath.  She is not  tachycardic.  Recommendation is that patient is given dose of Lovenox here in ED and scheduled for venous ultrasound tomorrow.   Lab Tests and EKG:  I Ordered, reviewed, and interpreted labs and EKG.  The pertinent results include:  BMP unremarkable CBC unremarkable Negative pregnancy test   Cardiac Monitoring:  The patient was maintained on a cardiac monitor.  I personally viewed and interpreted the cardiac monitored which showed an underlying rhythm of: NSR   Medicines  ordered and prescription drug management:  I ordered medication including: Morphine 4mg  IV for pain Zofran 4mg  IV for nausea from morphine   Lovenox 1 mg/kg Reevaluation of the patient after these medicines showed that the patient improved I have reviewed the patients home medicines and have made adjustments as needed  Disposition:  After consideration of the diagnostic results, physical exam, history and the patients response to treatment feel that the patent would benefit from discharge with strict return precautions.   Left lower extremity swelling: Discussed plan with patient about foregoing CTA in favor of scheduling venous ultrasound tomorrow with administration of anticoagulant tonight.  Patient is amenable to this plan.  Return precautions were discussed.  All questions were asked and answered and patient was discharged home in good condition.   Final Clinical Impression(s) / ED Diagnoses Final diagnoses:  Left leg swelling  Left leg pain    Rx / DC Orders ED Discharge Orders          Ordered    LE VENOUS        12/20/21 0133              Tonye Pearson, PA-C 12/20/21 Arnoldo Hooker, MD 12/20/21 [phone]

## 2021-12-21 ENCOUNTER — Ambulatory Visit (HOSPITAL_COMMUNITY)
Admission: RE | Admit: 2021-12-21 | Discharge: 2021-12-21 | Disposition: A | Discharge status: Home / Self Care | Payer: Medicaid Other | Source: Ambulatory Visit | Attending: Emergency Medicine | Admitting: Emergency Medicine

## 2021-12-21 ENCOUNTER — Other Ambulatory Visit: Payer: Self-pay

## 2021-12-21 DIAGNOSIS — M79672 Pain in left foot: Secondary | ICD-10-CM | POA: Diagnosis not present

## 2021-12-21 NOTE — Progress Notes (Signed)
VASCULAR LAB    Left lower extremity venous duplex has been performed.  See CV proc for preliminary results.   Saif Peter, RVT 12/21/2021, 3:16 PM

## 2022-07-01 ENCOUNTER — Encounter (HOSPITAL_COMMUNITY): Payer: Self-pay | Admitting: Emergency Medicine

## 2022-07-01 ENCOUNTER — Emergency Department (HOSPITAL_COMMUNITY)
Admission: EM | Admit: 2022-07-01 | Discharge: 2022-07-01 | Disposition: A | Discharge status: Home / Self Care | Payer: Medicaid Other | Attending: Emergency Medicine | Admitting: Emergency Medicine

## 2022-07-01 DIAGNOSIS — U071 COVID-19: Secondary | ICD-10-CM | POA: Diagnosis not present

## 2022-07-01 DIAGNOSIS — R52 Pain, unspecified: Secondary | ICD-10-CM | POA: Diagnosis present

## 2022-07-01 LAB — SARS CORONAVIRUS 2 BY RT PCR: SARS Coronavirus 2 by RT PCR: POSITIVE — AB

## 2022-07-01 MED ORDER — ONDANSETRON 4 MG PO TBDP
4.0000 mg | ORAL_TABLET | Freq: Once | ORAL | Status: AC
  Administered 2022-07-01: 4 mg via ORAL
  Filled 2022-07-01: qty 1

## 2022-07-01 NOTE — Discharge Instructions (Addendum)
It was a pleasure taking care of you!   Your COVID swab was positive for COVID.  According to the CDC, you must self quarantine for 5 days from the start of your symptoms.  Your quarantine period ends on  07/05/22.  You may take over-the-counter cough and cold medications as needed for your symptoms. Ensure to maintain fluid intake with tea, soup, broth, Pedialyte, Gatorade, water. You may follow-up with your primary care provider as needed.  Return to the Emergency Department if you are experiencing trouble breathing, worsening or increasing chest pain, decreased fluid intake or worsening symptoms.

## 2022-07-01 NOTE — ED Triage Notes (Signed)
Pt endorses loss of taste and smell since yesterday. Also having n/v, headache and body aches.

## 2022-07-01 NOTE — ED Provider Notes (Signed)
[facility] EMERGENCY DEPARTMENT Provider Note   CSN: [id number] Arrival date & time: 07/01/22  0900     History  Chief Complaint  Patient presents with   Generalized Body Aches    [facility] Munoz is a 36 y.o. female who presents to the ED complaining of generalized body aches onset yesterday. Unsure of sick contacts. Has associated  headache, nausea, vomiting, rhinorrhea, nasal congestion. No meds tried. Denies abdominal pain and fever.  The history is provided by the patient. No language interpreter was used.       Home Medications Prior to Admission medications   Medication Sig Start Date End Date Taking? Authorizing Provider  albuterol (VENTOLIN HFA) 108 (90 Base) MCG/ACT inhaler Inhale 1-2 puffs into the lungs every 6 (six) hours as needed for wheezing or shortness of breath. 12/19/21   Valentino Nose, NP  cyclobenzaprine (FLEXERIL) 10 MG tablet Take 0.5-1 tablets (5-10 mg total) by mouth 3 (three) times daily as needed for muscle spasms. Patient not taking: Reported on 12/25/2014 01/05/13   McGill, Marga Hoots, MD  HYDROcodone-acetaminophen (NORCO/VICODIN) 5-325 MG per tablet Take 1 tablet by mouth every 4 (four) hours as needed. 12/25/14   Ivery Quale, PA-C  ibuprofen (ADVIL,MOTRIN) 600 MG tablet Take 1 tablet (600 mg total) by mouth every 6 (six) hours as needed for pain. Patient not taking: Reported on 12/25/2014 01/05/13   McGill, Marga Hoots, MD      Allergies    Patient has no known allergies.    Review of Systems   Review of Systems  Constitutional:  Negative for fever.  HENT:  Positive for congestion and rhinorrhea. Negative for sore throat.   Gastrointestinal:  Positive for nausea and vomiting.  Musculoskeletal:  Positive for myalgias.  Neurological:  Positive for headaches.  All other systems reviewed and are negative.   Physical Exam Updated Vital Signs BP 119/82 (BP Location: Right Arm)   Pulse 89   Temp 97.6 F (36.4 C) (Oral)    Resp 17   Ht 5\' 8"  (1.727 m)   Wt 81 kg   SpO2 100%   BMI 27.15 kg/m  Physical Exam Vitals and nursing note reviewed.  Constitutional:      General: She is not in acute distress.    Appearance: She is not diaphoretic.  HENT:     Head: Normocephalic and atraumatic.     Mouth/Throat:     Pharynx: No oropharyngeal exudate.  Eyes:     General: No scleral icterus.    Conjunctiva/sclera: Conjunctivae normal.  Cardiovascular:     Rate and Rhythm: Normal rate and regular rhythm.     Pulses: Normal pulses.     Heart sounds: Normal heart sounds.  Pulmonary:     Effort: Pulmonary effort is normal. No respiratory distress.     Breath sounds: Normal breath sounds. No wheezing.  Abdominal:     General: Bowel sounds are normal.     Palpations: Abdomen is soft. There is no mass.     Tenderness: There is no abdominal tenderness. There is no guarding or rebound.  Musculoskeletal:        General: Normal range of motion.     Cervical back: Normal range of motion and neck supple.  Skin:    General: Skin is warm and dry.  Neurological:     Mental Status: She is alert.  Psychiatric:        Behavior: Behavior normal.     ED Results / Procedures /  Treatments   Labs (all labs ordered are listed, but only abnormal results are displayed) Labs Reviewed  SARS CORONAVIRUS 2 BY RT PCR - Abnormal; Notable for the following components:      Result Value   SARS Coronavirus 2 by RT PCR POSITIVE (*)    All other components within normal limits    EKG None  Radiology No results found.  Procedures Procedures    Medications Ordered in ED Medications  ondansetron (ZOFRAN-ODT) disintegrating tablet 4 mg (4 mg Oral Given 07/01/22 0920)    ED Course/ Medical Decision Making/ A&P Clinical Course as of 07/01/22 1045  Wed Jul 01, 2022  1033 SARS Coronavirus 2 by RT PCR(!): POSITIVE [SB]  1043 Pt resting comfortably asleep on stretcher on re-evaluation. Discussed with patient discharge treatment  plan. Answered all available questions. Pt appears safe for discharge. [SB]    Clinical Course User Index [SB] Denai Caba A, PA-C                           Medical Decision Making Amount and/or Complexity of Data Reviewed Labs:  Decision-making details documented in ED Course.  Risk Prescription drug management.   Pt presents with generalized body aches, loss of taste/smell onset yesterday. Unsure of sick contacts. No meds tried. Vital signs, pt afebrile, not tachycardic or hypoxic. On exam, pt with no acute cardiovascular, respiratory, abdominal exam findings. Differential diagnosis includes COVID, PNA, viral URI with cough.    Labs:  I ordered, and personally interpreted labs.  The pertinent results include:   COVID swab positive  Medications:  I ordered medication including zofran for symptom management Reevaluation of the patient after these medicines and interventions, I reevaluated the patient and found that they have improved I have reviewed the patients home medicines and have made adjustments as needed    Disposition: Pt presentation suspicious for COVID-19. Doubt PNA or viral URI with cough at this time. After consideration of the diagnostic results and the patients response to treatment, I feel that the patient would benefit from Discharge home. Thorough discussion regarding quarantine/isolation period as per CDC guidelines. Work note provided. Supportive care measures and strict return precautions discussed with patient at bedside. Pt acknowledges and verbalizes understanding. Pt appears safe for discharge. Follow up as indicated in discharge paperwork.    This chart was dictated using voice recognition software, Dragon. Despite the best efforts of this provider to proofread and correct errors, errors may still occur which can change documentation meaning.   Final Clinical Impression(s) / ED Diagnoses Final diagnoses:  COVID-19    Rx / DC Orders ED Discharge  Orders     None         Myka Hitz A, PA-C 07/01/22 1046    Arby Barrette, MD 07/14/22 1249

## 2022-07-05 ENCOUNTER — Other Ambulatory Visit: Payer: Self-pay

## 2022-07-05 ENCOUNTER — Emergency Department (HOSPITAL_COMMUNITY)
Admission: EM | Admit: 2022-07-05 | Discharge: 2022-07-05 | Discharge status: Against Medical Advice | Payer: Medicaid Other | Attending: Emergency Medicine | Admitting: Emergency Medicine

## 2022-07-05 ENCOUNTER — Encounter (HOSPITAL_COMMUNITY): Payer: Self-pay

## 2022-07-05 DIAGNOSIS — Z5321 Procedure and treatment not carried out due to patient leaving prior to being seen by health care provider: Secondary | ICD-10-CM | POA: Diagnosis not present

## 2022-07-05 DIAGNOSIS — U071 COVID-19: Secondary | ICD-10-CM | POA: Diagnosis not present

## 2022-07-05 DIAGNOSIS — R439 Unspecified disturbances of smell and taste: Secondary | ICD-10-CM | POA: Diagnosis present

## 2022-07-05 LAB — SARS CORONAVIRUS 2 BY RT PCR: SARS Coronavirus 2 by RT PCR: POSITIVE — AB

## 2022-07-05 NOTE — ED Triage Notes (Addendum)
Patient here for covid test. Patient was seen on 9/6 for same symptoms of loss of taste and smell Patient was positive 4 days ago. Need negaative test to go back to work

## 2022-07-05 NOTE — ED Notes (Signed)
Pt decided to leave while waiting for a room.
